# Patient Record
Sex: Female | Born: 1990 | Race: Black or African American | Hispanic: No | Marital: Single | Smoking: Never smoker
Health system: Southern US, Community
[De-identification: ages and names within clinical notes are randomized; demographics above are authoritative.]

## PROBLEM LIST (undated history)

## (undated) DIAGNOSIS — D649 Anemia, unspecified: Secondary | ICD-10-CM

## (undated) HISTORY — PX: MOUTH SURGERY: SHX715

---

## 2014-11-07 ENCOUNTER — Emergency Department (HOSPITAL_COMMUNITY)
Admission: EM | Admit: 2014-11-07 | Discharge: 2014-11-07 | Disposition: A | Discharge status: Home / Self Care | Payer: BLUE CROSS/BLUE SHIELD | Attending: Emergency Medicine | Admitting: Emergency Medicine

## 2014-11-07 ENCOUNTER — Encounter (HOSPITAL_COMMUNITY): Payer: Self-pay | Admitting: Emergency Medicine

## 2014-11-07 DIAGNOSIS — K088 Other specified disorders of teeth and supporting structures: Secondary | ICD-10-CM | POA: Insufficient documentation

## 2014-11-07 DIAGNOSIS — K0889 Other specified disorders of teeth and supporting structures: Secondary | ICD-10-CM

## 2014-11-07 MED ORDER — IBUPROFEN 800 MG PO TABS: 800.0000 mg | ORAL_TABLET | Freq: Three times a day (TID) | ORAL | Status: DC

## 2014-11-07 MED ORDER — IBUPROFEN 800 MG PO TABS
800.0000 mg | ORAL_TABLET | Freq: Once | ORAL | Status: AC
  Administered 2014-11-07: 800 mg via ORAL
  Filled 2014-11-07: qty 1

## 2014-11-07 NOTE — ED Notes (Signed)
PA at bedside.

## 2014-11-07 NOTE — ED Notes (Signed)
Pt. Reports worsening right lower molar pain onset yesterday unrelieved by OTC Aleve .

## 2014-11-07 NOTE — ED Provider Notes (Signed)
CSN: [id number]     Arrival date & time 11/07/14  0554 History   First MD Initiated Contact with Patient 11/07/14 [phone]     Chief Complaint  Patient presents with  . Dental Pain     (Consider location/radiation/quality/duration/timing/severity/associated sxs/prior Treatment) HPI Lori Garza is a 24 y.o. female who comes in for evaluation of dental pain. States she has had intermittent right lower dental pain for the past week, worse over the past 24 hours. States she has tried Aleve without relief of her symptoms. She has also tried saltwater gargles. She does not have a Education officer, community, is from [facility]. He denies any fevers, chills, difficulty swallowing, trismus, sore throat. Rates pain as moderate. No other aggravating or modifying factors.  History reviewed. No pertinent past medical history. Past Surgical History  Procedure Laterality Date  . Mouth surgery     No family history on file. Social History  Substance Use Topics  . Smoking status: Never Smoker   . Smokeless tobacco: None  . Alcohol Use: No   OB History    No data available     Review of Systems A 10 point review of systems was completed and was negative except for pertinent positives and negatives as mentioned in the history of present illness     Allergies  Review of patient's allergies indicates no known allergies.  Home Medications   Prior to Admission medications   Medication Sig Start Date End Date Taking? Authorizing Provider  ibuprofen (ADVIL,MOTRIN) 200 MG tablet Take 800 mg by mouth every 6 (six) hours as needed for moderate pain.   Yes Historical Provider, MD  ibuprofen (ADVIL,MOTRIN) 800 MG tablet Take 1 tablet (800 mg total) by mouth 3 (three) times daily. 11/07/14   Joycie Peek, PA-C   BP 114/82 mmHg  Pulse 69  Temp(Src) 98.7 F (37.1 C) (Oral)  Resp 16  Ht 5\' 4"  (1.626 m)  Wt 130 lb (58.968 kg)  BMI 22.30 kg/m2  SpO2 100%  LMP 10/31/2014 Physical Exam  Constitutional:   Awake, alert, nontoxic appearance.  HENT:  Head: Atraumatic.  Discomfort located to right mandibular molar. No obvious dental abnormality. Possible small fracture to tooth #31 Overall normal dentition. Mucous membranes are moist. No unilateral tonsillar swelling, uvula midline, no glossal swelling or elevation. No trismus. No fluctuance or evidence of a drainable abscess. No other evidence of emergent infection, Retropharyngeal or Peritonsillar abscess, Ludwig or Vincents angina. Tolerating secretions well. Patent airway   Eyes: Right eye exhibits no discharge. Left eye exhibits no discharge.  Neck: Neck supple.  Pulmonary/Chest: Effort normal. She exhibits no tenderness.  Abdominal: Soft. There is no tenderness. There is no rebound.  Musculoskeletal: She exhibits no tenderness.  Baseline ROM, no obvious new focal weakness.  Neurological:  Mental status and motor strength appears baseline for patient and situation.  Skin: No rash noted.  Psychiatric: She has a normal mood and affect.  Nursing note and vitals reviewed.   ED Course  Procedures (including critical care time) Labs Review Labs Reviewed - No data to display  Imaging Review No results found. I have personally reviewed and evaluated these images and lab results as part of my medical decision-making.   EKG Interpretation None     Meds given in ED:  Medications  ibuprofen (ADVIL,MOTRIN) tablet 800 mg (not administered)    New Prescriptions   IBUPROFEN (ADVIL,MOTRIN) 800 MG TABLET    Take 1 tablet (800 mg total) by mouth 3 (three) times daily.  MDM  Patient here for one week history of intermittent dental pain.  Vitals stable - WNL -afebrile Pt resting comfortably in ED. Refuses oral nerve block. Patient here for evaluation of progressive dental pain. No evidence of abscess or other infection. No trismus, glossal elevation, uvula is midline, patent airway. Will treat with anti-inflammatory's. Given resource guide  for dental care.  I discussed all relevant lab findings and imaging results with pt and they verbalized understanding. Discussed f/u with PCP within 48 hrs and return precautions, pt very amenable to plan.  Final diagnoses:  Pain, dental        Joycie Peek, PA-C 11/07/14 0730  Layla Maw Ward, DO 11/07/14 2308

## 2014-11-07 NOTE — Discharge Instructions (Signed)
Please take your medications as prescribed. You may also use oral anaesthetic such as Orajel for your discomfort. It is important to follow-up with dentistry for definitive care. Return to ED for worsening symptoms.  Dental Pain A tooth ache may be caused by cavities (tooth decay). Cavities expose the nerve of the tooth to air and hot or cold temperatures. It may come from an infection or abscess (also called a boil or furuncle) around your tooth. It is also often caused by dental caries (tooth decay). This causes the pain you are having. DIAGNOSIS  Your caregiver can diagnose this problem by exam. TREATMENT   If caused by an infection, it may be treated with medications which kill germs (antibiotics) and pain medications as prescribed by your caregiver. Take medications as directed.  Only take over-the-counter or prescription medicines for pain, discomfort, or fever as directed by your caregiver.  Whether the tooth ache today is caused by infection or dental disease, you should see your dentist as soon as possible for further care. SEEK MEDICAL CARE IF: The exam and treatment you received today has been provided on an emergency basis only. This is not a substitute for complete medical or dental care. If your problem worsens or new problems (symptoms) appear, and you are unable to meet with your dentist, call or return to this location. SEEK IMMEDIATE MEDICAL CARE IF:   You have a fever.  You develop redness and swelling of your face, jaw, or neck.  You are unable to open your mouth.  You have severe pain uncontrolled by pain medicine. MAKE SURE YOU:   Understand these instructions.  Will watch your condition.  Will get help right away if you are not doing well or get worse. Document Released: 02/17/2005 Document Revised: 05/12/2011 Document Reviewed: 10/06/2007 [facility] Patient Information 2015 [place], [place]. This information is not intended to replace advice given to you by your  health care provider. Make sure you discuss any questions you have with your health care provider.  Dental Care and Dentist Visits Dental care supports good overall health. Regular dental visits can also help you avoid dental pain, bleeding, infection, and other more serious health problems in the future. It is important to keep the mouth healthy because diseases in the teeth, gums, and other oral tissues can spread to other areas of the body. Some problems, such as diabetes, heart disease, and pre-term labor have been associated with poor oral health.  See your dentist every 6 months. If you experience emergency problems such as a toothache or broken tooth, go to the dentist right away. If you see your dentist regularly, you may catch problems early. It is easier to be treated for problems in the early stages.  WHAT TO EXPECT AT A DENTIST VISIT  Your dentist will look for many common oral health problems and recommend proper treatment. At your regular dental visit, you can expect:  Gentle cleaning of the teeth and gums. This includes scraping and polishing. This helps to remove the sticky substance around the teeth and gums (plaque). Plaque forms in the mouth shortly after eating. Over time, plaque hardens on the teeth as tartar. If tartar is not removed regularly, it can cause problems. Cleaning also helps remove stains.  Periodic X-rays. These pictures of the teeth and supporting bone will help your dentist assess the health of your teeth.  Periodic fluoride treatments. Fluoride is a natural mineral shown to help strengthen teeth. Fluoride treatmentinvolves applying a fluoride gel or varnish  to the teeth. It is most commonly done in children.  Examination of the mouth, tongue, jaws, teeth, and gums to look for any oral health problems, such as:  Cavities (dental caries). This is decay on the tooth caused by plaque, sugar, and acid in the mouth. It is best to catch a cavity when it is  small.  Inflammation of the gums caused by plaque buildup (gingivitis).  Problems with the mouth or malformed or misaligned teeth.  Oral cancer or other diseases of the soft tissues or jaws. KEEP YOUR TEETH AND GUMS HEALTHY For healthy teeth and gums, follow these general guidelines as well as your dentist's specific advice:  Have your teeth professionally cleaned at the dentist every 6 months.  Brush twice daily with a fluoride toothpaste.  Floss your teeth daily.  Ask your dentist if you need fluoride supplements, treatments, or fluoride toothpaste.  Eat a healthy diet. Reduce foods and drinks with added sugar.  Avoid smoking. TREATMENT FOR ORAL HEALTH PROBLEMS If you have oral health problems, treatment varies depending on the conditions present in your teeth and gums.  Your caregiver will most likely recommend good oral hygiene at each visit.  For cavities, gingivitis, or other oral health disease, your caregiver will perform a procedure to treat the problem. This is typically done at a separate appointment. Sometimes your caregiver will refer you to another dental specialist for specific tooth problems or for surgery. SEEK IMMEDIATE DENTAL CARE IF:  You have pain, bleeding, or soreness in the gum, tooth, jaw, or mouth area.  A permanent tooth becomes loose or separated from the gum socket.  You experience a blow or injury to the mouth or jaw area. Document Released: 10/30/2010 Document Revised: 05/12/2011 Document Reviewed: 10/30/2010 [facility] Patient Information 2015 [place], [place]. This information is not intended to replace advice given to you by your health care provider. Make sure you discuss any questions you have with your health care provider.   Emergency Department Resource Guide 1) Find a Doctor and Pay Out of Pocket Although you won't have to find out who is covered by your insurance plan, it is a good idea to ask around and get recommendations. You will  then need to call the office and see if the doctor you have chosen will accept you as a new patient and what types of options they offer for patients who are self-pay. Some doctors offer discounts or will set up payment plans for their patients who do not have insurance, but you will need to ask so you aren't surprised when you get to your appointment.  2) Contact Your Local Health Department Not all health departments have doctors that can see patients for sick visits, but many do, so it is worth a call to see if yours does. If you don't know where your local health department is, you can check in your phone book. The CDC also has a tool to help you locate your state's health department, and many state websites also have listings of all of their local health departments.  3) Find a Walk-in Clinic If your illness is not likely to be very severe or complicated, you may want to try a walk in clinic. These are popping up all over the country in pharmacies, drugstores, and shopping centers. They're usually staffed by nurse practitioners or physician assistants that have been trained to treat common illnesses and complaints. They're usually fairly quick and inexpensive. However, if you have serious medical issues or chronic  medical problems, these are probably not your best option.  No Primary Care Doctor: - Call Health Connect at  [phone] - they can help you locate a primary care doctor that  accepts your insurance, provides certain services, etc. - Physician Referral Service- [phone]  Chronic Pain Problems: Organization         Address  Phone   Notes  [facility]  [phone] Patients need to be referred by their primary care doctor.   Medication Assistance: Organization         Address  Phone   Notes  [facility] Medication [facility] [address] [place] [place], [place] [address] [phone] --Must be a resident of [facility] -- Must have NO  insurance coverage whatsoever (no Medicaid/ Medicare, etc.) -- The pt. MUST have a primary care doctor that directs their care regularly and follows them in the community   [facility]  [phone]   [employer]  [phone]    Agencies that provide inexpensive medical care: Organization         Address  Phone   Notes  [facility]  [phone]   [facility]    [phone]   [facility] [address] [place], [place] [address] [phone]   [facility] [address], [place] [phone]   Planned Parenthood    [phone]   [facility]    [phone]   [facility]  [address], [place] Phone:  [phone], Fax:  [phone] Hours of Operation:  9 am - 6 pm, M-F.  Also accepts Medicaid/Medicare and self-pay.  [facility]  [address], [place] Phone: [phone], Fax: [phone]. Hours of Operation:  8:30 am - 5:30 pm, M-F.  Also accepts Medicaid and self-pay.  [facility] [place] [address], [place] Phone: [phone]   [facility] [address] Natasha Bence [place], [place] [phone], Ext. [phone] Mondays & Thursdays: 7-9 AM.  First 15 patients are seen on a first come, first serve basis.    Medicaid-accepting [facility] Providers:  Organization         Address  Phone   Notes  [facility] [address], [place] [phone] Also accepts self-pay patients.  [facility] [address] Laurell Josephs [place], [place]  [phone]   [facility] [address], [place] [phone]   [facility] [address] [place] [phone]   Renaye Rakers [address], [place]   [phone] Only accepts [place] Access [place] patients after they have  their name applied to their card.   Self-Pay (no insurance) in [facility]:  Organization         Address  Phone   Notes  Sickle Cell Patients, [facility] Internal Medicine [address] [place], [place] [phone]   [facility] Urgent Care [address] [place], [place] [phone]   [facility] [place]  [address], [place] [phone]   [facility]/Dr. Osei-Bonsu  [address] [place] or [address], [place] [phone] Phone number for both [place] and [place] locations is the same.  Urgent  Medical and [facility] [address], [place] [phone]   [facility] [address], [place] or [address] [phone]   [facility] [address], [place] [phone], phone; [phone], fax Sees patients 1st and 3rd Saturday of every month.  Must not qualify for public or private insurance (i.e. Medicaid, Medicare, [place] Health Choice, Veterans' Benefits)  Household income should be no more than 200% of the poverty level The clinic cannot treat you if you are pregnant or think you are pregnant  Sexually transmitted diseases are not treated at the clinic.    Dental Care: Organization         Address  Phone  Notes  [facility] Department of [facility] Inpatient [facility] [address] [place], [place] [phone] Accepts children up to age 60 who are enrolled in [place] or [place] Health Choice; pregnant women with a Medicaid card; and children who have applied for Medicaid or [place] Health Choice, but were declined, whose parents can pay a reduced fee at time of service.  [facility] Department of [facility]  [address], [place] [phone] Accepts children up to age 53 who are enrolled in [place] or [place] Health Choice; pregnant women with a Medicaid card; and children who have applied  for Medicaid or [place] Health Choice, but were declined, whose parents can pay a reduced fee at time of service.  [facility]  [address] [place], [place] [phone] Patients are seen by appointment only. Walk-ins are not accepted. [facility] will see patients 53 years of age and older. Monday - Tuesday (8am-5pm) Most Wednesdays (8:30-5pm) $30 per visit, cash only  [facility] Adult Dental Access PRO[facility]  [address], [facility] [phone] Patients are seen by appointment only. Walk-ins are not accepted. [facility] will see patients 77 years of age and older. One Wednesday Evening (Monthly: Volunteer Based).  $30 per visit, cash only  [employer]  [phone] for adults; Children under age 62, call Graduate Pediatric Dentistry at [phone]. Children aged 107-14, please call [phone] to request a pediatric application.  Dental services are provided in all areas of dental care including fillings, crowns and bridges, complete and partial dentures, implants, gum treatment, root canals, and extractions. Preventive care is also provided. Treatment is provided to both adults and children. Patients are selected via a lottery and there is often a waiting list.   [facility] [address], [place]  [phone] [email or web address]   [facility] [address] [place], [place] [phone], Ext. [phone] Second and Fourth Thursday of each month, opens at 6:30 AM; Clinic ends at 9 AM.  Patients are seen on a first-come first-served basis, and a limited number are seen during each clinic.   [facility]  [address] Ether Griffins [place], [place] [phone]   Eligibility Requirements You must have lived in [place], [place], or [place] counties for at least the last three months.   You cannot be eligible for state or federal sponsored [place], including [employer],  [place], or [employer].   You generally cannot be eligible for healthcare insurance through your employer.    How to apply: Eligibility screenings are held every Tuesday and Wednesday afternoon from 1:00 pm until 4:00 pm. You do not need an appointment for the interview!  [facility] [address] Lib[place] Negrin [place], [place] [phone]   [facility] Health Department  [phone]   [facility] Health Department  [phone]   [facility] Health Department  [phone]    Behavioral Health Resources in the Community: Intensive Outpatient Programs Organization         Address  Phone  Notes  [facility] [address] [place], [place] [phone]   [facility] Outpatient [address], [place], [place] [phone]   [facility] [address], [place], [place]  [phone]   [facility] Mental Health [address],  [place], [place] [phone] or [phone]   Substance Abuse Resources Organization         Address  Phone  Notes  [facility]  [phone]   [facility]  [phone]   [facility]  [phone]   Floydene Flock  [phone]   Residential & Outpatient Substance Abuse Program  [phone]   Psychological Services Organization         Address  Phone  Notes  [facility]  [phone]   [facility]  [phone]   [facility] Mental Health [address] [place] [phone] or [phone]    Mobile Crisis Teams Organization         Address  Phone  Notes  [facility], Mobile Crisis Care Unit  [phone]   [facility]  [address] [place], [place] [phone]   Doristine Locks [address] [place] [phone]    Self-Help/Support Groups Organization         Address  Phone             Notes  [facility] [place] - variety of support  groups  336- I7437963 Call for more information  Narcotics Anonymous (NA), [facility] [address], [employer] [place]  2 meetings at this location   Statistician         Address  Phone  Notes  [facility] [address],    [place]  [phone]   [facility]  [address], [place] [address], [place], [place] [phone]   [facility] Treatment Facility [address] [place], [place] [phone] Admissions: 8am-3pm M-F  [facility] [address],    [place], [place] [phone]   [facility] [address] [place], [place], [place] [phone]   [facility] [address],  [place], [place] [phone]   [facility] - Intensive Outpatient [address], [place], [place] [phone]   [facility] [address] [place]  [place], [place] [phone] or [phone]   [facility] ([facility]) [address], [place], [place] [phone] Accepts Medicaid  [facility] [address],  [place] [phone] Substance Abuse/Addiction Treatment   [facility] Organization         Address  Phone  Notes  [facility]  3656190826   Angie Fava, PhD [address] Ervin Knack [place], [place]   [phone] or [phone]   [facility] Behavioral   [address] [place], [place] [phone]   [facility] [address], [place], [place] [phone] Insurance/Medicaid/sponsorship through [employer] and Families [address]   [place], [place] [phone] Therapy/tele-psych/case  [facility] [address]   [place], [place] [phone]    Dr. Lolly Mustache  [phone]   [facility]  [employer] [facility] Dept. 1) [address], [place] 2) [address] [place] 3)   [address], [place] [phone]  [phone]   [facility] Child Abuse Hotline 208-126-3961 or [phone] (After Hours)

## 2014-11-07 NOTE — ED Notes (Signed)
PT ambulated with baseline gait; VSS; A&Ox3; no signs of distress; respirations even and unlabored; skin warm and dry; no questions upon discharge.  

## 2015-07-01 ENCOUNTER — Encounter (HOSPITAL_COMMUNITY): Payer: Self-pay | Admitting: *Deleted

## 2015-07-01 ENCOUNTER — Emergency Department (HOSPITAL_COMMUNITY)
Admission: EM | Admit: 2015-07-01 | Discharge: 2015-07-01 | Disposition: A | Discharge status: Home / Self Care | Payer: No Typology Code available for payment source | Attending: Emergency Medicine | Admitting: Emergency Medicine

## 2015-07-01 DIAGNOSIS — Y92410 Unspecified street and highway as the place of occurrence of the external cause: Secondary | ICD-10-CM | POA: Diagnosis not present

## 2015-07-01 DIAGNOSIS — Y999 Unspecified external cause status: Secondary | ICD-10-CM | POA: Insufficient documentation

## 2015-07-01 DIAGNOSIS — M25562 Pain in left knee: Secondary | ICD-10-CM | POA: Diagnosis present

## 2015-07-01 DIAGNOSIS — S8002XA Contusion of left knee, initial encounter: Secondary | ICD-10-CM | POA: Diagnosis not present

## 2015-07-01 DIAGNOSIS — Z791 Long term (current) use of non-steroidal anti-inflammatories (NSAID): Secondary | ICD-10-CM | POA: Insufficient documentation

## 2015-07-01 DIAGNOSIS — Y939 Activity, unspecified: Secondary | ICD-10-CM | POA: Insufficient documentation

## 2015-07-01 MED ORDER — METHOCARBAMOL 500 MG PO TABS
500.0000 mg | ORAL_TABLET | Freq: Once | ORAL | Status: AC
  Administered 2015-07-01: 500 mg via ORAL
  Filled 2015-07-01: qty 1

## 2015-07-01 MED ORDER — METHOCARBAMOL 500 MG PO TABS: 1000.0000 mg | ORAL_TABLET | Freq: Four times a day (QID) | ORAL | Status: DC | PRN

## 2015-07-01 NOTE — ED Notes (Signed)
Patient was alert, oriented and stable upon discharge. RN went over AVS and patient had no further questions.  

## 2015-07-01 NOTE — Discharge Instructions (Signed)
Rest, Ice intermittently (in the first 24-48 hours), Gentle compression with an Ace wrap, and elevate (Limb above the level of the heart)   Take up to 800mg  of ibuprofen (that is usually 4 over the counter pills)  3 times a day for 5 days. Take with food.  You can also take  tylenol (acetaminophen) 975mg  (this is 3 over the counter pills) four times a day. Do not drink alcohol or combine with other medications that have acetaminophen as an ingredient (Read the labels!).    For breakthrough pain you may take Robaxin. Do not drink alcohol, drive or operate heavy machinery when taking Robaxin.  Do not hesitate to return to the emergency room for any new, worsening or concerning symptoms.  Please obtain primary care using resource guide below. Let them know that you were seen in the emergency room and that they will need to obtain records for further outpatient management.    Contusion A contusion is a deep bruise. Contusions are the result of a blunt injury to tissues and muscle fibers under the skin. The injury causes bleeding under the skin. The skin overlying the contusion may turn blue, purple, or yellow. Minor injuries will give you a painless contusion, but more severe contusions may stay painful and swollen for a few weeks.  CAUSES  This condition is usually caused by a blow, trauma, or direct force to an area of the body. SYMPTOMS  Symptoms of this condition include:  Swelling of the injured area.  Pain and tenderness in the injured area.  Discoloration. The area may have redness and then turn blue, purple, or yellow. DIAGNOSIS  This condition is diagnosed based on a physical exam and medical history. An X-ray, CT scan, or MRI may be needed to determine if there are any associated injuries, such as broken bones (fractures). TREATMENT  Specific treatment for this condition depends on what area of the body was injured. In general, the best treatment for a contusion is resting, icing,  applying pressure to (compression), and elevating the injured area. This is often called the RICE strategy. Over-the-counter anti-inflammatory medicines may also be recommended for pain control.  HOME CARE INSTRUCTIONS   Rest the injured area.  If directed, apply ice to the injured area:  Put ice in a plastic bag.  Place a towel between your skin and the bag.  Leave the ice on for 20 minutes, 2-3 times per day.  If directed, apply light compression to the injured area using an elastic bandage. Make sure the bandage is not wrapped too tightly. Remove and reapply the bandage as directed by your health care provider.  If possible, raise (elevate) the injured area above the level of your heart while you are sitting or lying down.  Take over-the-counter and prescription medicines only as told by your health care provider. SEEK MEDICAL CARE IF:  Your symptoms do not improve after several days of treatment.  Your symptoms get worse.  You have difficulty moving the injured area. SEEK IMMEDIATE MEDICAL CARE IF:   You have severe pain.  You have numbness in a hand or foot.  Your hand or foot turns pale or cold.   This information is not intended to replace advice given to you by your health care provider. Make sure you discuss any questions you have with your health care provider.   Document Released: 11/27/2004 Document Revised: 11/08/2014 Document Reviewed: 07/05/2014 Elsevier Interactive Patient Education 2016 [employer] Resource Guide Financial Assistance The  United Ways 211 is a great source of information about community services available.  Access by dialing 2-1-1 from anywhere in [place], or by website -  PooledIncome.pl.   Other Local Resources (Updated 03/2015)  Financial Assistance   Services    Phone Number and Address  [facility]  Low-cost medical care - 1st and 3rd Saturday of every month  Must not qualify for public or  private insurance and must have limited income [phone] [address] [place], [place]    [place] [employer] of Social Services  Child care  Emergency assistance for housing and [employer]  Medicaid [phone] [address] [place], [place] [address]   [facility] Department  Low-cost medical care for children, communicable diseases, sexually-transmitted diseases, immunizations, maternity care, womens health and family planning [phone] [address] [place], [place] [address]  [facility]   Medication assistance for [facility] residents  Must meet income requirements [phone] [address] [place], [place].    [facility] Social Services  Child care  Emergency assistance for housing and [employer]  Medicaid [phone] [address] [place], [place] [address]  [facility]   Low-cost medical care,   Monday through Friday, 9 am to 6 pm.   Accepts Medicare/Medicaid, and self-pay [phone] [address] [place], [place] [address]  [facility]  Low-cost medical care - Monday through Friday, 8:30 am - 5:30 pm  Accepts Medicaid and self-pay [phone] [address] [place], [place] [address]   [facility]  Primary medical care, including for those with sickle cell disease  Accepts Medicare, Medicaid, insurance and self-pay [phone] [address] [place], [place]  [facility]   Primary medical care  Accepts Medicare, [place], insurance and self-pay [phone] [address] [place], [place] [address]   [facility] Department of Social Services  Child care  Emergency assistance for housing and [employer]  Medicaid [phone] [address] [place], [place] [address]   [facility] Department of Health and [employer]  Child care  Emergency assistance for housing and [employer]  Medicaid [phone] [address] [place], [place] [address]   [facility] Medication Assistance Program  Medication assistance for [facility] residents with no insurance only  Must have a primary care doctor [phone] [address] [place], [place]  [facility]   Primary medical care  [place], [place], insurance  [phone] [place], [place]  [facility]   Medication assistance [phone]  [facility]   Primary medical care  Accepts Medicare, [place], insurance and self-pay [phone] [place], [place] [address]  [facility]   Primary medical care  Accepts Medicare, [place], insurance and self-pay [phone] [address] [place], [place] [address]  [facility]  For [place] residents between the ages of 71 and 76 who do not have any form of health insurance, Medicare, [place], or [place] benefits.  Services are provided free of charge to uninsured patients who fall within federal poverty guidelines.    Hours: Tuesdays and Thursdays, 4:15 - 8 pm [phone] [address] [place], [place] [address]  [facility]     Primary medical care  Dental care  Nutritional counseling  Pharmacy  Accepts Medicaid, Medicare, most insurance.  Fees are adjusted based on ability to pay.   [phone] [facility] [address] [place], [place]  [phone] [facility] [address] [place], [place]  [phone] [facility] [place], [place]  [phone] [facility], [address] [place], [place]  [phone] [facility] [address] [place], [place]  [facility]  Womens health and family planning [phone] [address] [place], [place]  [facility] Department of Social Services  Child care  Emergency assistance for housing and [employer]  Medicaid [phone] [address], [place], [place] [address]   [facility]    Ages 65 and older  Hours: Mondays and Thursdays, 7:00 am - 9:00 am Patients are seen on a first come, first served basis. [phone], ext. [phone] [place], [place]  [facility] Division of Social Services  Child care  Emergency assistance for housing and [employer]  Medicaid [phone] [address] [place], [place] [address]  [facility]  Medication assistance  Rental assistance  Food pantry  Medication assistance  Housing assistance  Emergency food distribution  Utility assistance [phone] [address] [place], [place]  [phone]  [address] [place], [place] [address] Hours: Tuesdays and Thursdays from 9am - 12 noon by appointment only  [phone] [address] [place], [place] [address]  [facility] Lanae [facility]   Accepts private insurance, [place], and [place].  Payment is based on a sliding scale for those without insurance.  Hours: Mondays, Tuesdays and Thursdays, 8:30 am - 5:30 pm.   [phone] [address] [place], [place]  [facility] - Family Medicine at [facility], [place], and [place].  Payment is based on a sliding scale for those without insurance. [phone] [address] [place], [place]  [facility] - Pediatrics at E[address] Scientist, research (physical sciences), [employer], and [place].  Payment is based on a sliding scale for those without insurance [phone] [address], [employer], [place]  [facility] -  Pediatrics at [employer], [place], and [place].  Payment is based on a sliding scale for those without insurance. [phone] [address] [place], [place]  [facility] - Pediatrics at [facility], [place], and [place].  Payment is based on a sliding scale for those without insurance. [phone], [phone] [place], [place].    [facility]  Maternity care.  Accepts Medicaid and self-pay. [phone] [address] [place], [place]
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
## 2016-07-01 ENCOUNTER — Emergency Department (INDEPENDENT_AMBULATORY_CARE_PROVIDER_SITE_OTHER)
Admission: EM | Admit: 2016-07-01 | Discharge: 2016-07-01 | Disposition: A | Discharge status: Home / Self Care | Payer: BLUE CROSS/BLUE SHIELD | Source: Home / Self Care | Attending: Family Medicine | Admitting: Family Medicine

## 2016-07-01 ENCOUNTER — Encounter: Payer: Self-pay | Admitting: *Deleted

## 2016-07-01 ENCOUNTER — Emergency Department (INDEPENDENT_AMBULATORY_CARE_PROVIDER_SITE_OTHER): Payer: BLUE CROSS/BLUE SHIELD

## 2016-07-01 DIAGNOSIS — X501XXA Overexertion from prolonged static or awkward postures, initial encounter: Secondary | ICD-10-CM | POA: Diagnosis not present

## 2016-07-01 DIAGNOSIS — M79671 Pain in right foot: Secondary | ICD-10-CM

## 2016-07-01 DIAGNOSIS — S92351A Displaced fracture of fifth metatarsal bone, right foot, initial encounter for closed fracture: Secondary | ICD-10-CM

## 2016-07-01 HISTORY — DX: Anemia, unspecified: D64.9

## 2016-07-01 MED ORDER — HYDROCODONE-ACETAMINOPHEN 5-325 MG PO TABS: 1.0000 | ORAL_TABLET | Freq: Four times a day (QID) | ORAL | 0 refills | Status: DC | PRN

## 2016-07-01 NOTE — ED Provider Notes (Signed)
CSN: [id number]     Arrival date & time 07/01/16  1648 History   First MD Initiated Contact with Patient 07/01/16 1709     Chief Complaint  Patient presents with  . Foot Pain   (Consider location/radiation/quality/duration/timing/severity/associated sxs/prior Treatment) HPI Lori Garza is a 26 y.o. female presenting to UC with c/o sudden onset Right foot pain that started earlier this afternoon after jumping up and down landing sideways on her Right foot.  Pain is 10/10 with any weight bearing, movement of foot or palpation.  No pain medications taken PTA. She tried putting ice on her foot but it hurt too bad. No other injuries.    Past Medical History:  Diagnosis Date  . Anemia    Past Surgical History:  Procedure Laterality Date  . MOUTH SURGERY     Family History  Problem Relation Age of Onset  . Diabetes Mother    Social History  Substance Use Topics  . Smoking status: Never Smoker  . Smokeless tobacco: Not on file  . Alcohol use Yes     Comment: socially   OB History    No data available     Review of Systems  Musculoskeletal: Positive for arthralgias, gait problem, joint swelling and myalgias.       Right foot  Skin: Negative for color change and wound.  Neurological: Negative for weakness and numbness.    Allergies  Patient has no known allergies.  Home Medications   Prior to Admission medications   Medication Sig Start Date End Date Taking? Authorizing Provider  HYDROcodone-acetaminophen (NORCO/VICODIN) 5-325 MG tablet Take 1-2 tablets by mouth every 6 (six) hours as needed for moderate pain or severe pain. 07/01/16   Junius Finner, PA-C   Meds Ordered and Administered this Visit  Medications - No data to display  BP 108/76 (BP Location: Left Arm)   Pulse 79   Temp 97.3 F (36.3 C) (Oral)   Resp 16   Ht  (1.651 m)   Wt 132 lb (59.9 kg)   LMP 06/17/2016   SpO2 100%   BMI 21.97 kg/m  No data found.   Physical Exam  Constitutional: She is  oriented to person, place, and time. She appears well-developed and well-nourished.  HENT:  Head: Normocephalic and atraumatic.  Eyes: EOM are normal.  Neck: Normal range of motion.  Cardiovascular: Normal rate.   Pulses:      Dorsalis pedis pulses are 2+ on the right side.       Posterior tibial pulses are 2+ on the right side.  Pulmonary/Chest: Effort normal.  Musculoskeletal: She exhibits edema and tenderness.  Right foot: mild to moderate edema. Tenderness along lateral aspect and dorsum of foot.  Limited ROM due to pain when moving ankle or toes. No tenderness to toes or ankle Calf is soft, non-tender.  Neurological: She is alert and oriented to person, place, and time.  Skin: Skin is warm and dry. Capillary refill takes less than 2 seconds.  Right foot: skin in tact. No ecchymosis or erythema.  Psychiatric: She has a normal mood and affect. Her behavior is normal.  Nursing note and vitals reviewed.   Urgent Care Course     Procedures (including critical care time)  Labs Review Labs Reviewed - No data to display  Imaging Review Dg Foot Complete Right  Result Date: 07/01/2016 CLINICAL DATA:  Twisting injury today with right foot pain. Fifth toe pain. EXAM: RIGHT FOOT COMPLETE - 3+ VIEW COMPARISON:  None.  FINDINGS: Examination demonstrates a displaced oblique fracture of the distal aspect of the fifth metatarsal without involvement of the articular surface. Remainder of the exam is within normal. IMPRESSION: Displaced oblique fracture of the distal aspect fifth metatarsal. Electronically Signed   By: Elberta Fortis M.D.   On: 07/01/2016 17:29     MDM   1. Closed non-physeal fracture of fifth metatarsal bone of right foot, initial encounter   2. Right foot pain    Fracture of Right foot.   Walking boot applied, crutches provided Rx: Norco for in the evening for severe pain Encouraged use of acetaminophen and ibuprofen during the day  Home care instructions provided f/u  with Sports Medicine in 1-2 weeks to ensure proper healing.      Junius Finner, PA-C 07/01/16 [phone]

## 2016-07-01 NOTE — ED Triage Notes (Signed)
Pt c/o RT foot pain post fall today. She has applied ice. No OTC meds.

## 2016-07-03 ENCOUNTER — Ambulatory Visit (INDEPENDENT_AMBULATORY_CARE_PROVIDER_SITE_OTHER): Payer: BLUE CROSS/BLUE SHIELD | Admitting: Family Medicine

## 2016-07-03 ENCOUNTER — Encounter: Payer: Self-pay | Admitting: Family Medicine

## 2016-07-03 ENCOUNTER — Telehealth: Payer: Self-pay | Admitting: Family Medicine

## 2016-07-03 DIAGNOSIS — S92351A Displaced fracture of fifth metatarsal bone, right foot, initial encounter for closed fracture: Secondary | ICD-10-CM | POA: Diagnosis not present

## 2016-07-03 DIAGNOSIS — S92353A Displaced fracture of fifth metatarsal bone, unspecified foot, initial encounter for closed fracture: Secondary | ICD-10-CM | POA: Insufficient documentation

## 2016-07-03 MED ORDER — IBUPROFEN 800 MG PO TABS: 800.0000 mg | ORAL_TABLET | Freq: Three times a day (TID) | ORAL | 0 refills | Status: AC | PRN

## 2016-07-03 NOTE — Patient Instructions (Signed)
Thank you for coming in today. Take ibuprofen for pain as needed.  Use norco sparingly.  Recheck in about 1 week.  Return sooner if needed.  Advance weight bearing as able.    Metatarsal Fracture A metatarsal fracture is a broken bone in one of the five bones that connect your toes to the rest of your foot (forefoot fracture). Metatarsals are long bones that can be stressed or cracked easily. A metatarsal fracture can be:  A stress fracture. Stress fractures are cracks in the surface of the metatarsal bone. Athletes often get stress fractures.  A complete fracture. A complete fracture goes all the way through the bone. The bone that connects to the pinky toe (fifth metatarsal) is the most commonly fractured metatarsal. Ballet dancers often fracture this bone. What are the causes? This type of fracture may be caused by:  A sudden twisting of your foot.  A fall onto your foot.  Overuse or repetitive exercise. What increases the risk? This condition is more likely to develop in people who:  Play contact sports.  Have a bone disease.  Have a low calcium level. What are the signs or symptoms? Symptoms of this condition include:  Pain that is worse when walking or standing.  Pain when pressing on the foot or moving the toes.  Swelling.  Bruising on the top or bottom of the foot.  A foot that appears shorter than the other one. How is this diagnosed? This condition is diagnosed with a physical exam. You may also have imaging tests, such as:  X-rays.  A CT scan.  MRI. How is this treated? Treatment for this condition depends on its severity and whether a bone has moved out of place. Treatment may involve:  Rest.  Wearing foot support such as a cast, splint, or boot for several weeks.  Using crutches.  Surgery to move bones back into the right position. Surgery is usually needed if there are many pieces of broken bone or bones that are very out of place (displaced  fracture).  Physical therapy. This may be needed to help you regain full movement and strength in your foot. You will need to return to your health care provider to have X-rays taken until your bones heal. Your health care provider will look at the X-rays to make sure that your foot is healing well. Follow these instructions at home: If you have a cast:   Do not stick anything inside the cast to scratch your skin. Doing that increases your risk of infection.  Check the skin around the cast every day. Report any concerns to your health care provider. You may put lotion on dry skin around the edges of the cast. Do not apply lotion to the skin underneath the cast.  Keep the cast clean and dry. If you have a splint or a supportive boot:   Wear it as directed by your health care provider. Remove it only as directed by your health care provider.  Loosen it if your toes become numb and tingle, or if they turn cold and blue.  Keep it clean and dry. Bathing   Do not take baths, swim, or use a hot tub until your health care provider approves. Ask your health care provider if you can take showers. You may only be allowed to take sponge baths for bathing.  If your health care provider approves bathing and showering, cover the cast or splint with a watertight plastic bag to protect it from water.  Do not let the cast or splint get wet. Managing pain, stiffness, and swelling   If directed, apply ice to the injured area (if you have a splint, not a cast).  Put ice in a plastic bag.  Place a towel between your skin and the bag.  Leave the ice on for 20 minutes, 2-3 times per day.  Move your toes often to avoid stiffness and to lessen swelling.  Raise (elevate) the injured area above the level of your heart while you are sitting or lying down. Driving   Do not drive or operate heavy machinery while taking pain medicine.  Do not drive while wearing foot support on a foot that you use for  driving. Activity   Return to your normal activities as directed by your health care provider. Ask your health care provider what activities are safe for you.  Perform exercises as directed by your health care provider or physical therapist. Safety   Do not use the injured foot to support your body weight until your health care provider says that you can. Use crutches as directed by your health care provider. General instructions   Do not put pressure on any part of the cast or splint until it is fully hardened. This may take several hours.  Do not use any tobacco products, including cigarettes, chewing tobacco, or e-cigarettes. Tobacco can delay bone healing. If you need help quitting, ask your health care provider.  Take medicines only as directed by your health care provider.  Keep all follow-up visits as directed by your health care provider. This is important. Contact a health care provider if:  You have a fever.  Your cast, splint, or boot is too loose or too tight.  Your cast, splint, or boot is damaged.  Your pain medicine is not helping.  You have pain, tingling, or numbness in your foot that is not going away. Get help right away if:  You have severe pain.  You have tingling or numbness in your foot that is getting worse.  Your foot feels cold or becomes numb.  Your foot changes color. This information is not intended to replace advice given to you by your health care provider. Make sure you discuss any questions you have with your health care provider. Document Released: 11/09/2001 Document Revised: 10/23/2015 Document Reviewed: 12/14/2013 Elsevier Interactive Patient Education  2017 [employer]

## 2016-07-03 NOTE — Telephone Encounter (Signed)
Yes please schedule for 4:15

## 2016-07-03 NOTE — Progress Notes (Signed)
   Subjective:    I'm seeing this patient as a consultation for:  Junius Finnerrin O'Malley PA-C  CC: Right Foot Fracture  HPI: Kirstyn Several fracture to her right foot on May 1 after an inversion injury. She was seen in urgent care the same day where she was diagnosed with a mildly displaced distal fifth metatarsal fracture. She was treated crutches in a cam walker.. In the interim she notes continued pain especially when she weight bears. 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 She's been using ibuprofen over-the-counter and very occasionally the hydrocodone that was prescribed. She would like a few days off from work of possible noted  significant pain.  Past medical history, Surgical history, Family history not pertinant except as noted below, Social history, Allergies, and medications have been entered into the medical record, reviewed, and no changes needed.   Review of Systems: No headache, visual changes, nausea, vomiting, diarrhea, constipation, dizziness, abdominal pain, skin rash, fevers, chills, night sweats, weight loss, swollen lymph nodes, body aches, joint swelling, muscle aches, chest pain, shortness of breath, mood changes, visual or auditory hallucinations.   Objective:    Vitals:   07/03/16 1601  BP: 122/69  Pulse: 81   General: Well Developed, well nourished, and in no acute distress.  Neuro/Psych: Alert and oriented x3, extra-ocular muscles intact, able to move all 4 extremities, sensation grossly intact. Skin: Warm and dry, no rashes noted.  Respiratory: Not using accessory muscles, speaking in full sentences, trachea midline.  Cardiovascular: Pulses palpable, no extremity edema. Abdomen: Does not appear distended. MSK: Right foot well-appearing slightly swollen. Tender to palpation distal fifth metatarsal. Pulses capillary refill and sensation intact. Foot motion is intact.   Study Result   CLINICAL DATA:  Twisting injury today  with right foot pain. Fifth toe pain.  EXAM: RIGHT FOOT COMPLETE - 3+ VIEW  COMPARISON:  None.  FINDINGS: Examination demonstrates a displaced oblique fracture of the distal aspect of the fifth metatarsal without involvement of the articular surface. Remainder of the exam is within normal.  IMPRESSION: Displaced oblique fracture of the distal aspect fifth metatarsal.   Electronically Signed   By: Elberta Fortisaniel  Boyle M.D.   On: 07/01/2016 17:29     Impression and Recommendations:    Assessment and Plan: 26 y.o. female with Minimally displaced right fifth metatarsal fracture. Patient is doing pretty well and I think the treatment as appropriate. Continue limited to no weightbearing with a cam walker boot. Recheck in about a week. At that time will repeat x-ray. I'm doubtful patient will need reduction or surgical fixation. Continue ibuprofen as needed for severe pain and use Norco very sparingly. Work note provided to midweek next week.   Discussed warning signs or symptoms. Please see discharge instructions. Patient expresses understanding.

## 2016-07-03 NOTE — Telephone Encounter (Signed)
Lori Garza is an Urgent Care follow up for a broken foot. She was referred to see you today immediately by urgent care. 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 She wants to know if she is able to see you today if possible, she will be a New Ortho. Give patient a call to inform her of your decision.

## 2016-07-10 ENCOUNTER — Ambulatory Visit: Payer: BLUE CROSS/BLUE SHIELD | Admitting: Family Medicine

## 2016-07-14 ENCOUNTER — Ambulatory Visit (INDEPENDENT_AMBULATORY_CARE_PROVIDER_SITE_OTHER): Payer: BLUE CROSS/BLUE SHIELD | Admitting: Family Medicine

## 2016-07-14 ENCOUNTER — Ambulatory Visit (INDEPENDENT_AMBULATORY_CARE_PROVIDER_SITE_OTHER): Payer: BLUE CROSS/BLUE SHIELD

## 2016-07-14 ENCOUNTER — Encounter: Payer: Self-pay | Admitting: Family Medicine

## 2016-07-14 VITALS — BP 107/83 | HR 57 | Ht 66.25 in | Wt 133.2 lb

## 2016-07-14 DIAGNOSIS — S92351A Displaced fracture of fifth metatarsal bone, right foot, initial encounter for closed fracture: Secondary | ICD-10-CM

## 2016-07-14 DIAGNOSIS — X501XXD Overexertion from prolonged static or awkward postures, subsequent encounter: Secondary | ICD-10-CM | POA: Diagnosis not present

## 2016-07-14 DIAGNOSIS — S92351D Displaced fracture of fifth metatarsal bone, right foot, subsequent encounter for fracture with routine healing: Secondary | ICD-10-CM

## 2016-07-14 NOTE — Patient Instructions (Signed)
Continue the boot. Recheck in 3 weeks.  OK to transition to a turf toe plate in your sneakers.  Let pain be your guide.  Use the boot with activity.     Get a Steel Turf Toe insole.  Do a Microbiologistgoogle Search for [employer]f Toe Full Steel Insoles

## 2016-07-14 NOTE — Progress Notes (Signed)
   Oprah Lorrin MaisKeyes is a 26 y.o. female who [facility] today for follow-up right fifth metatarsal fracture. Patient was seen on May 3 for distal metatarsal fracture. She's been doing well with the cam walker boot and crutches. She is able to walk normally with the boot now. She notes the pain is reasonably well controlled. She feels well.   Past Medical History:  Diagnosis Date  . Anemia    Past Surgical History:  Procedure Laterality Date  . MOUTH SURGERY     Social History  Substance Use Topics  . Smoking status: Never Smoker  . Smokeless tobacco: Never Used  . Alcohol use Yes     Comment: socially     ROS:  As above   Medications: Current Outpatient Prescriptions  Medication Sig Dispense Refill  . ibuprofen (ADVIL,MOTRIN) 800 MG tablet Take 1 tablet (800 mg total) by mouth every 8 (eight) hours as needed. 60 tablet 0   No current facility-administered medications for this visit.    No Known Allergies   Exam:  BP 107/83   Pulse (!) 57   Ht 5' 6.25" (1.683 m)   Wt 133 lb 3.5 oz (60.4 kg)   LMP 06/23/2016   SpO2 100%   BMI 21.34 kg/m  General: Well Developed, well nourished, and in no acute distress.  Neuro/Psych: Alert and oriented x3, extra-ocular muscles intact, able to move all 4 extremities, sensation grossly intact. Skin: Warm and dry, no rashes noted.  Respiratory: Not using accessory muscles, speaking in full sentences, trachea midline.  Cardiovascular: Pulses palpable, no extremity edema. Abdomen: Does not appear distended. MSK: Right foot is unremarkable. With no obvious swelling or bruising. Tender to palpation along the distal fifth metatarsal.   X-ray right foot shows a continued mildly displaced fifth metatarsal fracture distally with no significant change from the prior x-ray. No further displacement is seen. Awaiting formal radiology review   Assessment and Plan: 26 y.o. female with fifth  metatarsal fracture doing well. Continue cam walker boot. Recheck in 3 weeks. Work note provided.    Orders Placed This Encounter  Procedures  . DG Foot Complete Right    Standing Status:   Future    Number of Occurrences:   1    Standing Expiration Date:   09/13/2017    Order Specific Question:   Reason for Exam (SYMPTOM  OR DIAGNOSIS REQUIRED)    Answer:   eval fx    Order Specific Question:   Is patient pregnant?    Answer:   No    Order Specific Question:   Preferred imaging location?    [facility]    Order Specific Question:   Radiology Contrast Protocol - do NOT remove file path    Answer:   [email or web address]    Discussed warning signs or symptoms. Please see discharge instructions. Patient expresses understanding.

## 2016-08-14 ENCOUNTER — Ambulatory Visit (INDEPENDENT_AMBULATORY_CARE_PROVIDER_SITE_OTHER): Payer: BLUE CROSS/BLUE SHIELD

## 2016-08-14 ENCOUNTER — Ambulatory Visit (INDEPENDENT_AMBULATORY_CARE_PROVIDER_SITE_OTHER): Payer: BLUE CROSS/BLUE SHIELD | Admitting: Family Medicine

## 2016-08-14 ENCOUNTER — Ambulatory Visit: Payer: BLUE CROSS/BLUE SHIELD | Admitting: Family Medicine

## 2016-08-14 ENCOUNTER — Encounter: Payer: Self-pay | Admitting: Family Medicine

## 2016-08-14 VITALS — BP 96/63 | HR 92 | Ht 66.25 in

## 2016-08-14 DIAGNOSIS — S92351A Displaced fracture of fifth metatarsal bone, right foot, initial encounter for closed fracture: Secondary | ICD-10-CM | POA: Diagnosis not present

## 2016-08-14 DIAGNOSIS — X501XXD Overexertion from prolonged static or awkward postures, subsequent encounter: Secondary | ICD-10-CM | POA: Diagnosis not present

## 2016-08-14 DIAGNOSIS — S92351D Displaced fracture of fifth metatarsal bone, right foot, subsequent encounter for fracture with routine healing: Secondary | ICD-10-CM

## 2016-08-14 NOTE — Patient Instructions (Signed)
Thank you for coming in today. Advance to regular shoes.  Let pain be your guide.  Slowly advance activity as tolerated.   Recheck in 1 month.

## 2016-08-14 NOTE — Progress Notes (Signed)
   Lori Garza is a 26 y.o. female who [facility] today for follow-up right foot fracture. Patient was originally seen on May 1 for fracture of the right fifth metatarsal. She's been treated with immobilization since. She was somewhat lost to follow-up from May 14. She has been continuing to use the cam walker boot. When she does not walk with the boot she does not have much pain. She does experience some soreness especially when she tries to wear high heels.   Past Medical History:  Diagnosis Date  . Anemia    Past Surgical History:  Procedure Laterality Date  . MOUTH SURGERY     Social History  Substance Use Topics  . Smoking status: Never Smoker  . Smokeless tobacco: Never Used  . Alcohol use Yes     Comment: socially     ROS:  As above   Medications: Current Outpatient Prescriptions  Medication Sig Dispense Refill  . ibuprofen (ADVIL,MOTRIN) 800 MG tablet Take 1 tablet (800 mg total) by mouth every 8 (eight) hours as needed. 60 tablet 0   No current facility-administered medications for this visit.    No Known Allergies   Exam:  BP 96/63   Pulse 92   Ht 5' 6.25" (1.683 m)  General: Well Developed, well nourished, and in no acute distress.  Neuro/Psych: Alert and oriented x3, extra-ocular muscles intact, able to move all 4 extremities, sensation grossly intact. Skin: Warm and dry, no rashes noted.  Respiratory: Not using accessory muscles, speaking in full sentences, trachea midline.  Cardiovascular: Pulses palpable, no extremity edema. Abdomen: Does not appear distended. MSK: Right foot normal-appearing nontender normal motion pulses capillary refill and sensation are intact.     X-ray right foot: Healing distal fifth metatarsal fracture no displacement. Fracture line faintly visible. Awaiting formal radiology review    No results found for this or any previous visit (from the past 48 hour(s)). No  results found.    Assessment and Plan: 26 y.o. female with right foot fracture doing well. To transition to a regular shoe. Advance activity as tolerated. Recheck in 1 month.    Orders Placed This Encounter  Procedures  . DG Foot Complete Right    Standing Status:   Future    Standing Expiration Date:   10/14/2017    Order Specific Question:   Reason for Exam (SYMPTOM  OR DIAGNOSIS REQUIRED)    Answer:   eval fx    Order Specific Question:   Is patient pregnant?    Answer:   No    Order Specific Question:   Preferred imaging location?    Answer[facility]    Order Specific Question:   Radiology Contrast Protocol - do NOT remove file path    Answer:   [email or web address]   No orders of the defined types were placed in this encounter.   Discussed warning signs or symptoms. Please see discharge instructions. Patient expresses understanding.

## 2017-06-30 IMAGING — DX DG FOOT COMPLETE 3+V*R*
3 series · 3 of 3 positions shown · non-contrast
Comparison: 07/01/2016

CLINICAL DATA: Follow-up of closed fifth metatarsal fracture.
Subsequent encounter

EXAM:
RIGHT FOOT COMPLETE - 3+ VIEW

[foot ap]
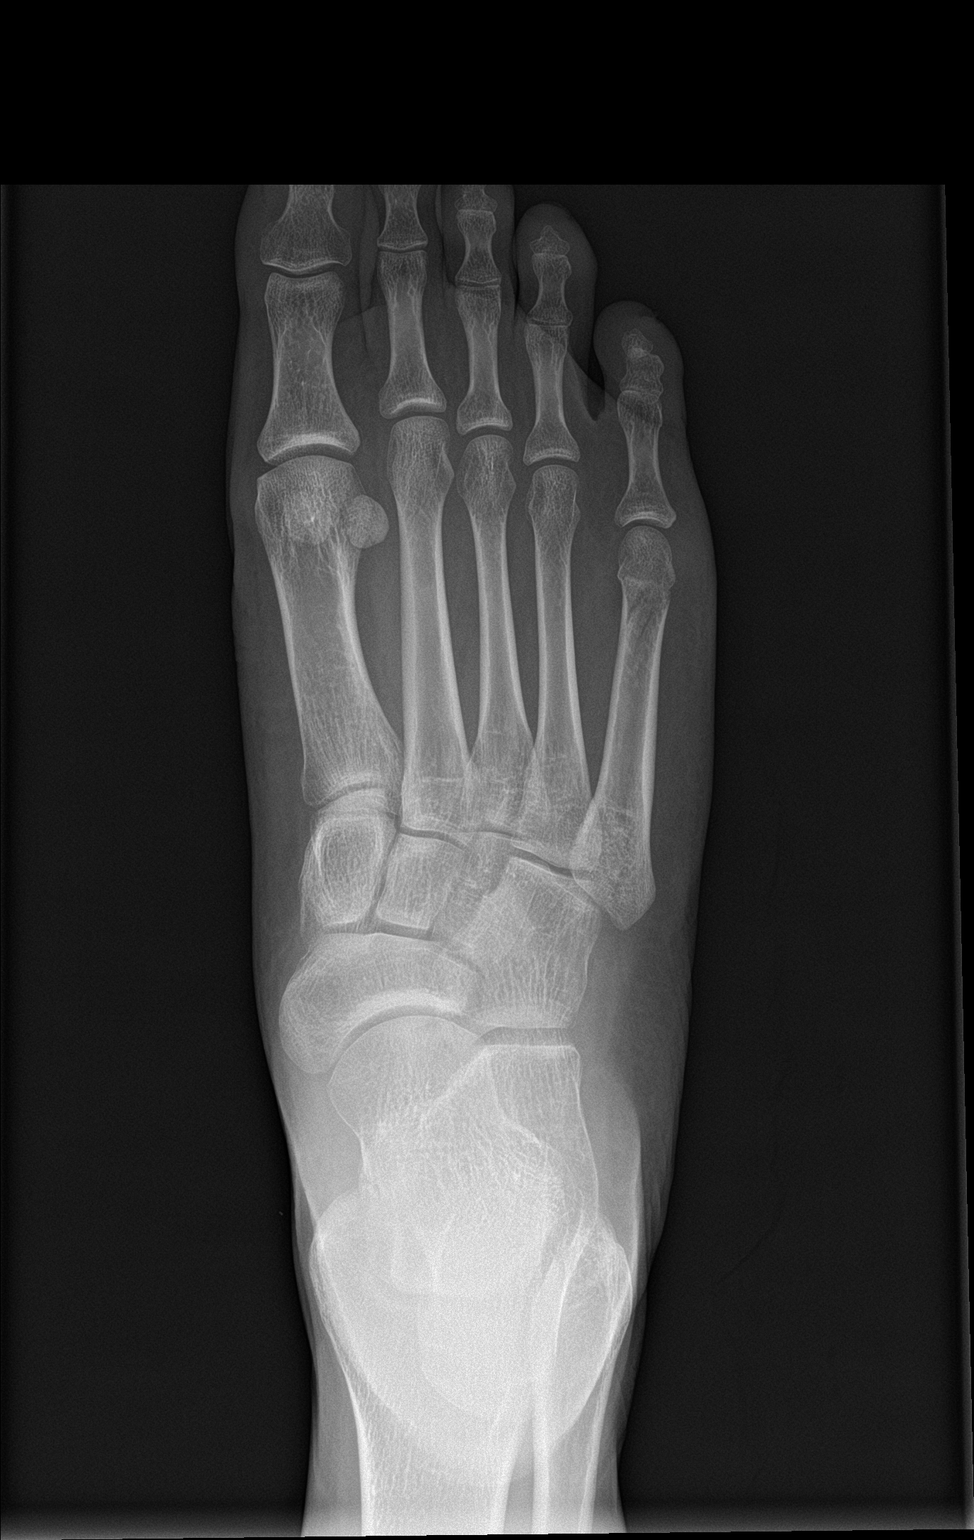

[foot obl]
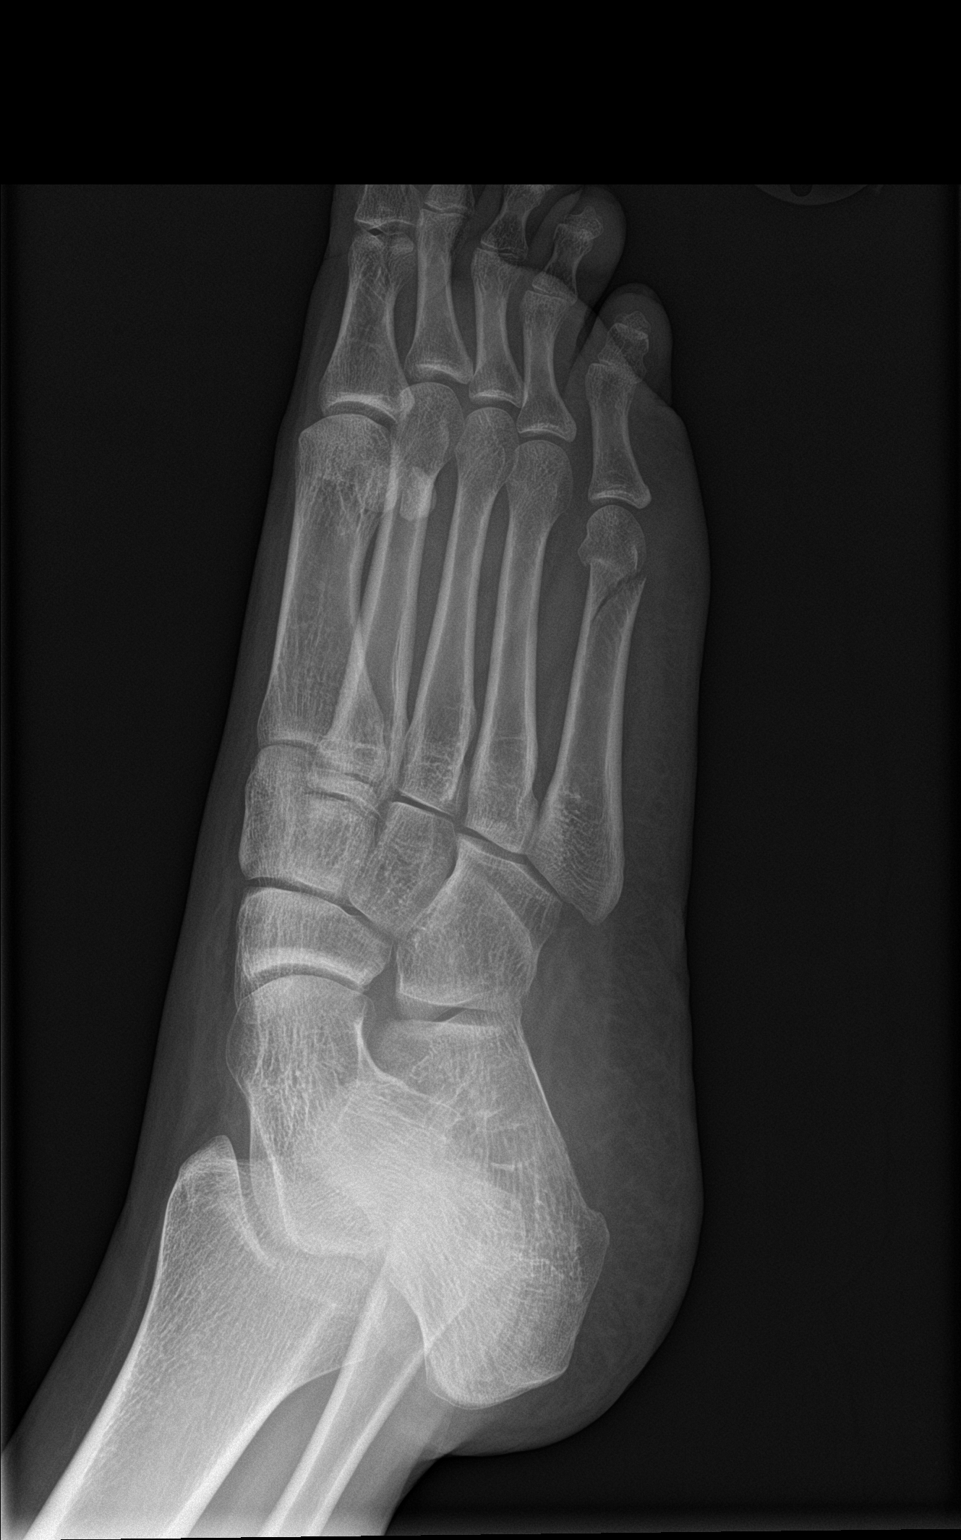

[foot lat]
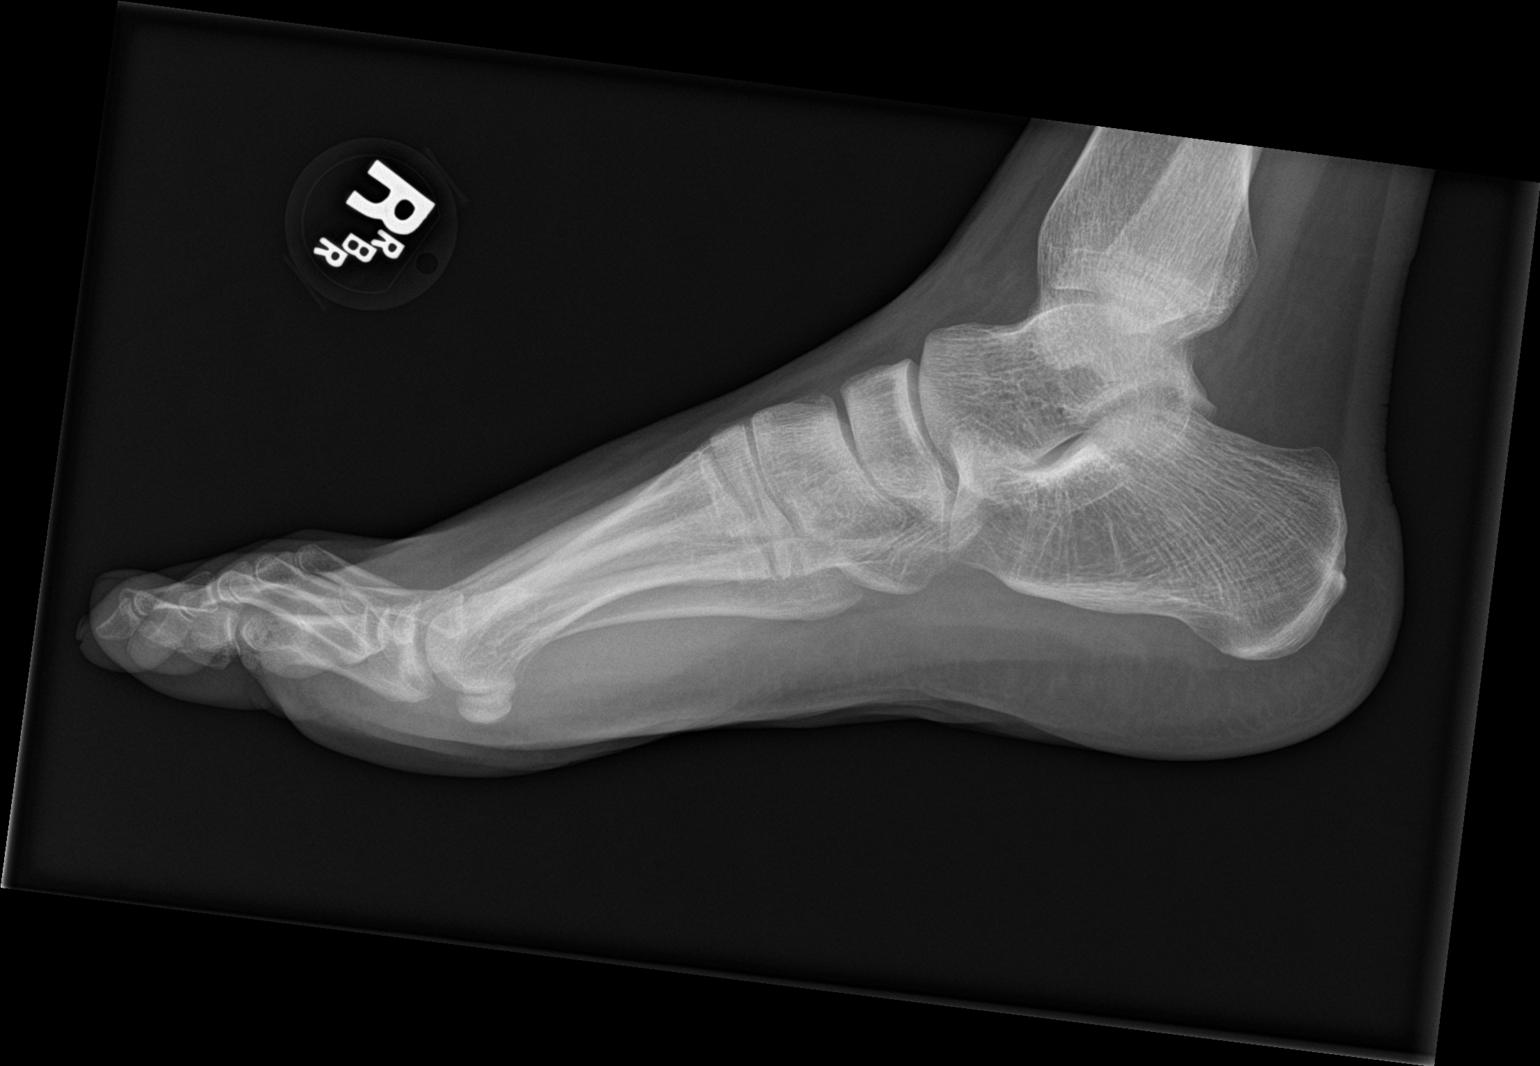

[3 of 3 positions shown; findings below may reference images not displayed]

FINDINGS: An oblique fifth metatarsal shaft/ neck fracture is in stable
position. Callus is not yet visualized. No new osseous abnormality.
No malalignment.
IMPRESSION: Fifth metatarsal neck and distal shaft fracture shows stable
alignment. Callus is not yet detected.

## 2017-07-31 IMAGING — DX DG FOOT COMPLETE 3+V*R*
3 series · 3 of 3 positions shown · non-contrast
Comparison: 07/14/2016

CLINICAL DATA: Fifth metatarsal fracture follow-up. Limping and
occasional pain. Subsequent encounter.

EXAM:
RIGHT FOOT COMPLETE - 3+ VIEW

[foot ap]
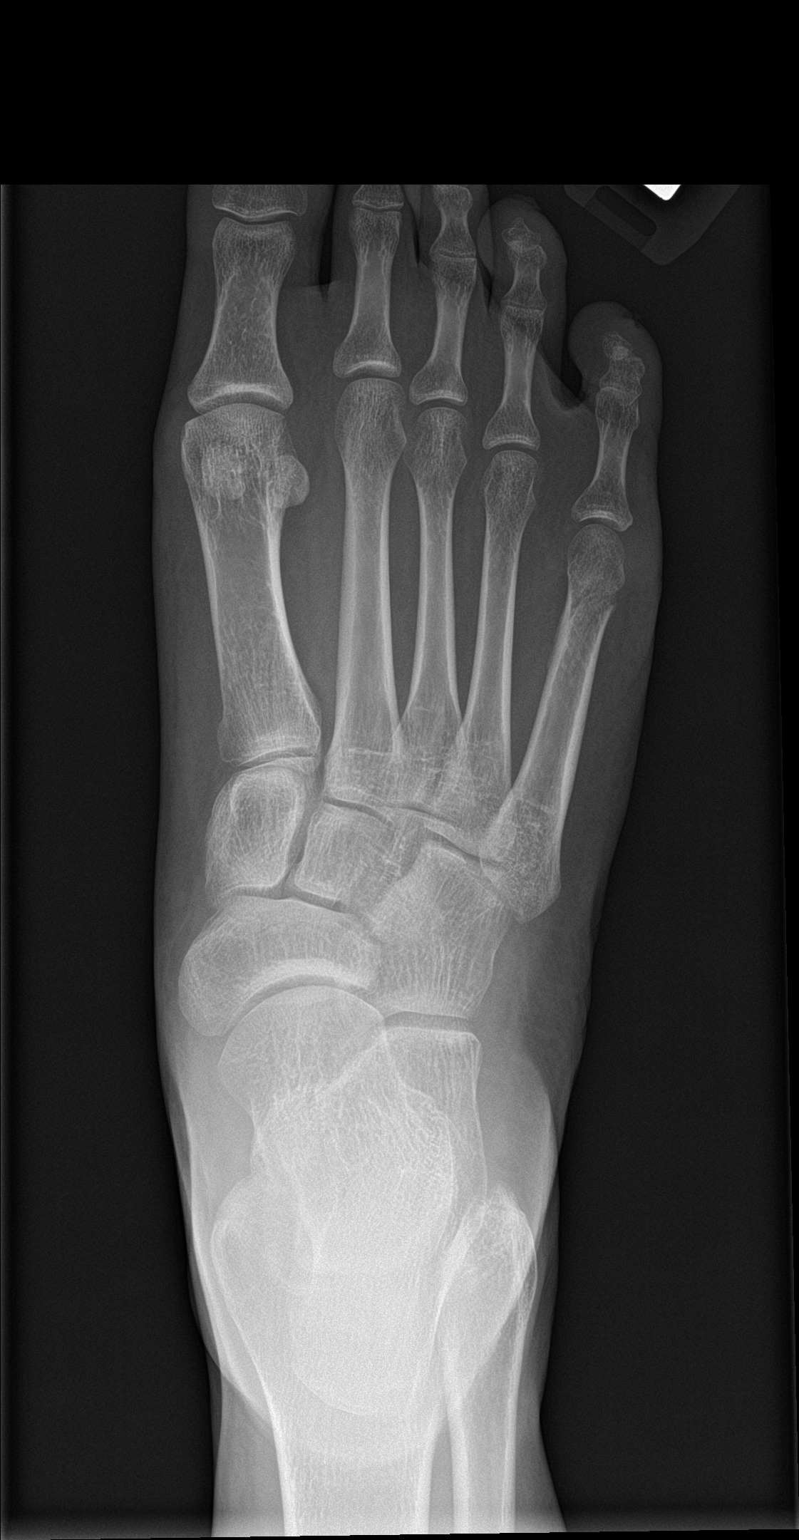

[foot obl]
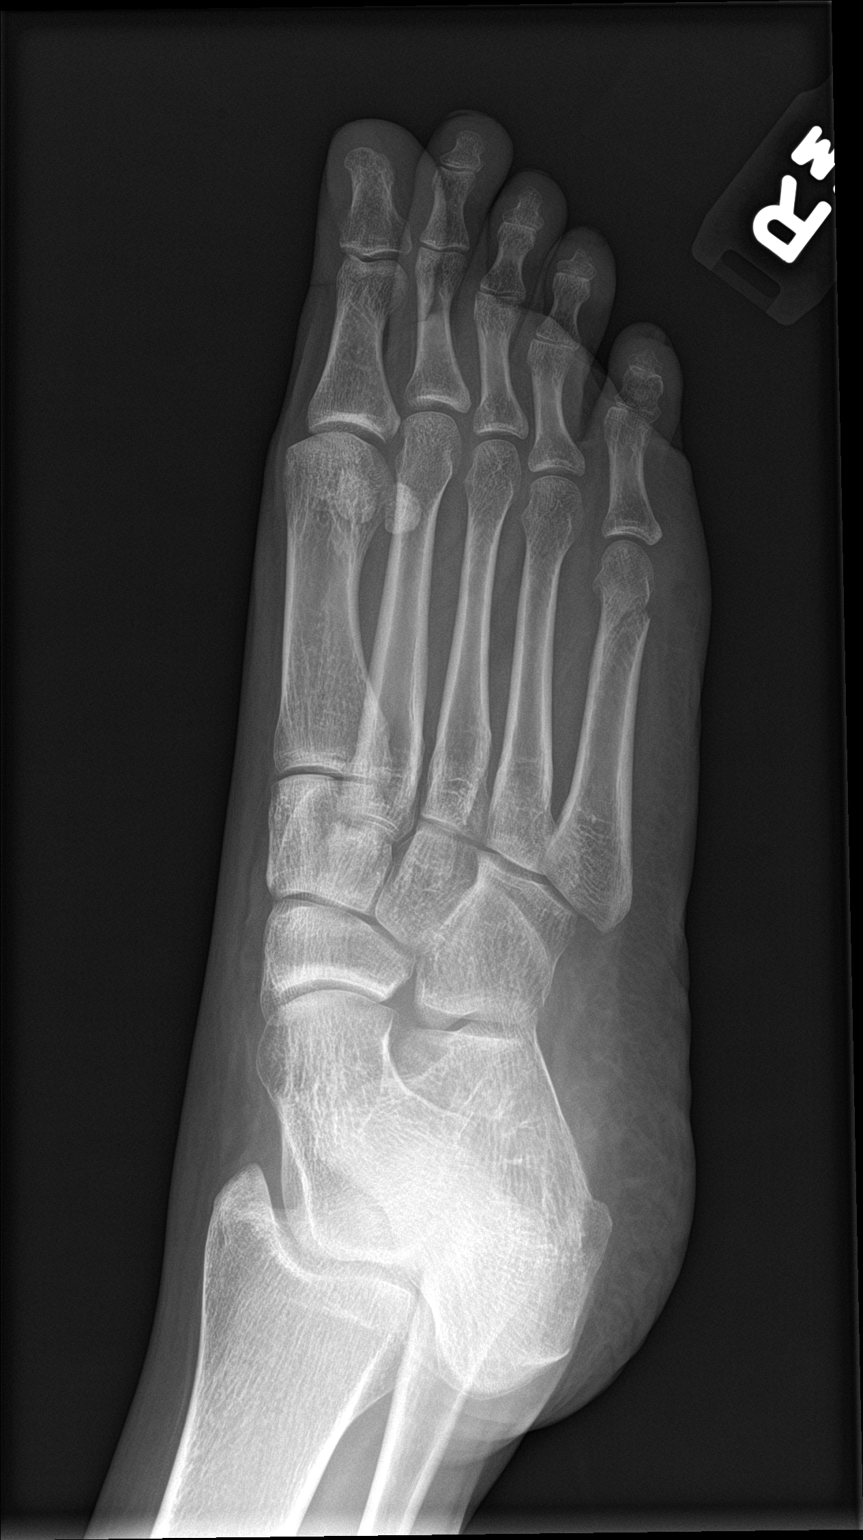

[foot lat]
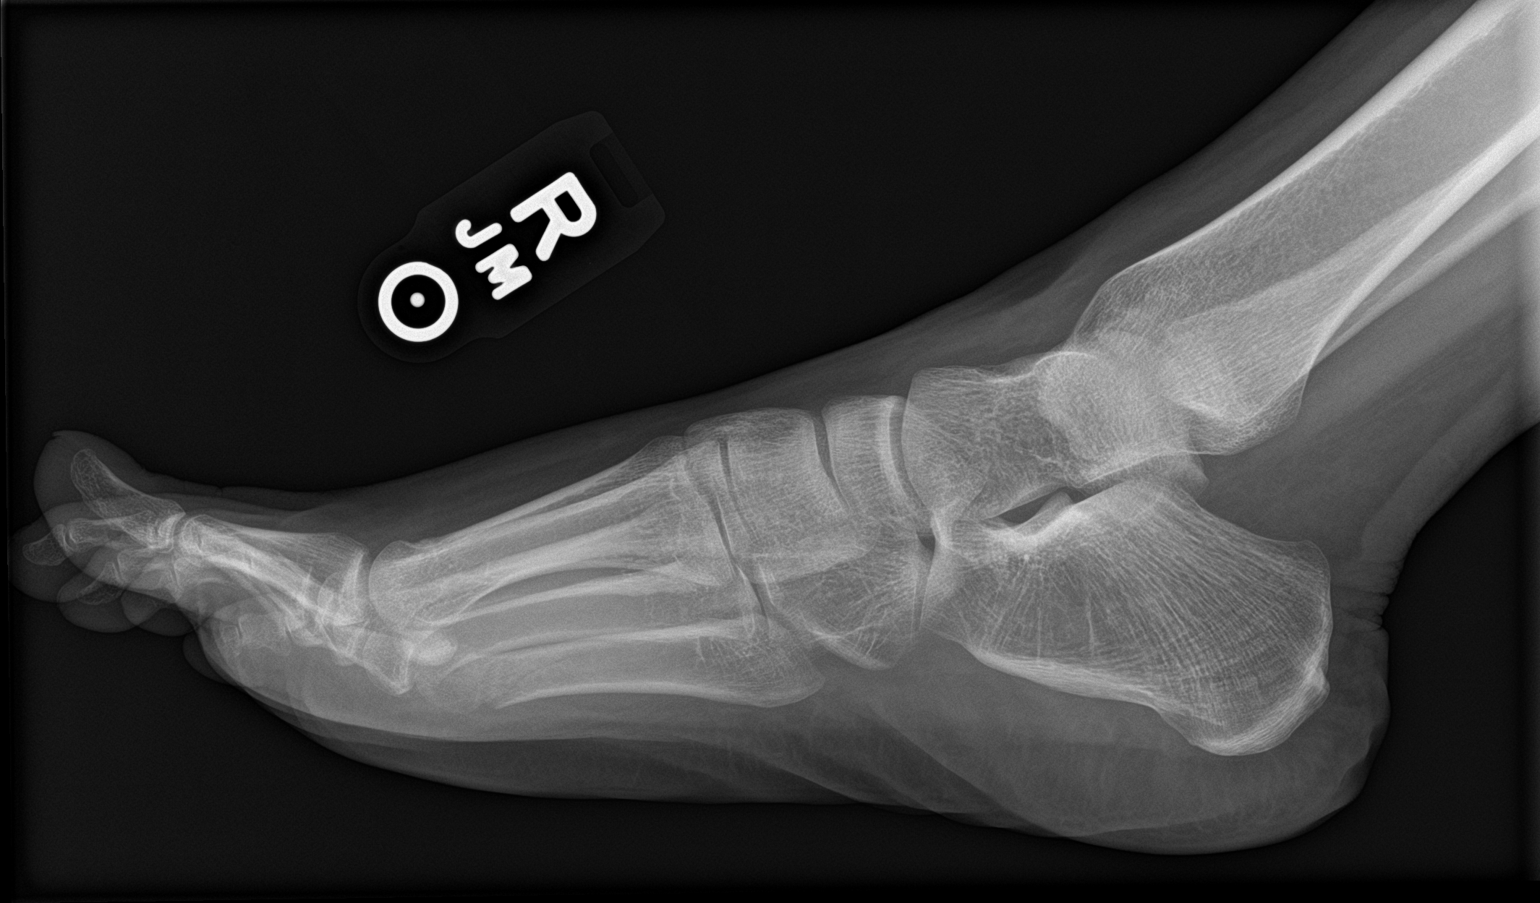

[3 of 3 positions shown; findings below may reference images not displayed]

FINDINGS: An oblique fracture of the distal shaft/neck of the fifth metatarsal
demonstrates unchanged position and alignment. The fracture line is
slightly less visible than on the prior study which may reflect some
early healing, although substantial callus formation is not yet
apparent. No new osseous abnormality is seen.
IMPRESSION: Unchanged position and alignment of distal fifth metatarsal fracture
with likely very early radiographic changes of healing.
